# Patient Record
Sex: Female | Born: 1965 | Race: White | Hispanic: No | Marital: Single | Smoking: Current every day smoker
Health system: Southern US, Community
[De-identification: ages and names within clinical notes are randomized; demographics above are authoritative.]

## PROBLEM LIST (undated history)

## (undated) DIAGNOSIS — N879 Dysplasia of cervix uteri, unspecified: Secondary | ICD-10-CM

## (undated) DIAGNOSIS — F17200 Nicotine dependence, unspecified, uncomplicated: Secondary | ICD-10-CM

## (undated) DIAGNOSIS — I499 Cardiac arrhythmia, unspecified: Secondary | ICD-10-CM

## (undated) DIAGNOSIS — I341 Nonrheumatic mitral (valve) prolapse: Secondary | ICD-10-CM

## (undated) DIAGNOSIS — C801 Malignant (primary) neoplasm, unspecified: Secondary | ICD-10-CM

## (undated) DIAGNOSIS — J302 Other seasonal allergic rhinitis: Secondary | ICD-10-CM

## (undated) HISTORY — PX: TONSILLECTOMY: SUR1361

## (undated) HISTORY — PX: CERVICAL CONE BIOPSY: SUR198

---

## 2018-11-19 ENCOUNTER — Encounter: Payer: Self-pay | Admitting: *Deleted

## 2018-11-19 ENCOUNTER — Emergency Department: Payer: PRIVATE HEALTH INSURANCE

## 2018-11-19 ENCOUNTER — Other Ambulatory Visit: Payer: Self-pay

## 2018-11-19 ENCOUNTER — Emergency Department
Admission: EM | Admit: 2018-11-19 | Discharge: 2018-11-19 | Disposition: A | Discharge status: Home / Self Care | Payer: PRIVATE HEALTH INSURANCE | Attending: Emergency Medicine | Admitting: Emergency Medicine

## 2018-11-19 DIAGNOSIS — K8020 Calculus of gallbladder without cholecystitis without obstruction: Secondary | ICD-10-CM | POA: Insufficient documentation

## 2018-11-19 DIAGNOSIS — F17210 Nicotine dependence, cigarettes, uncomplicated: Secondary | ICD-10-CM | POA: Insufficient documentation

## 2018-11-19 DIAGNOSIS — K821 Hydrops of gallbladder: Secondary | ICD-10-CM | POA: Insufficient documentation

## 2018-11-19 DIAGNOSIS — R1010 Upper abdominal pain, unspecified: Secondary | ICD-10-CM | POA: Diagnosis present

## 2018-11-19 DIAGNOSIS — R1011 Right upper quadrant pain: Secondary | ICD-10-CM

## 2018-11-19 DIAGNOSIS — D135 Benign neoplasm of extrahepatic bile ducts: Secondary | ICD-10-CM

## 2018-11-19 LAB — CBC WITH DIFFERENTIAL/PLATELET
Abs Immature Granulocytes: 0.02 10*3/uL (ref 0.00–0.07)
Basophils Absolute: 0.1 10*3/uL (ref 0.0–0.1)
Basophils Relative: 1 %
Eosinophils Absolute: 0.3 10*3/uL (ref 0.0–0.5)
Eosinophils Relative: 4 %
HCT: 40.3 % (ref 36.0–46.0)
Hemoglobin: 13.5 g/dL (ref 12.0–15.0)
Immature Granulocytes: 0 %
Lymphocytes Relative: 32 %
Lymphs Abs: 2.4 10*3/uL (ref 0.7–4.0)
MCH: 31.5 pg (ref 26.0–34.0)
MCHC: 33.5 g/dL (ref 30.0–36.0)
MCV: 93.9 fL (ref 80.0–100.0)
Monocytes Absolute: 0.5 10*3/uL (ref 0.1–1.0)
Monocytes Relative: 7 %
Neutro Abs: 4.3 10*3/uL (ref 1.7–7.7)
Neutrophils Relative %: 56 %
Platelets: 263 10*3/uL (ref 150–400)
RBC: 4.29 MIL/uL (ref 3.87–5.11)
RDW: 13.4 % (ref 11.5–15.5)
WBC: 7.5 10*3/uL (ref 4.0–10.5)
nRBC: 0 % (ref 0.0–0.2)

## 2018-11-19 LAB — COMPREHENSIVE METABOLIC PANEL
ALT: 25 U/L (ref 0–44)
AST: 39 U/L (ref 15–41)
Albumin: 4.1 g/dL (ref 3.5–5.0)
Alkaline Phosphatase: 100 U/L (ref 38–126)
Anion gap: 9 (ref 5–15)
BUN: 25 mg/dL — ABNORMAL HIGH (ref 6–20)
CO2: 27 mmol/L (ref 22–32)
Calcium: 9.2 mg/dL (ref 8.9–10.3)
Chloride: 106 mmol/L (ref 98–111)
Creatinine, Ser: 0.73 mg/dL (ref 0.44–1.00)
GFR calc Af Amer: >60 mL/min (ref >60)
GFR calc non Af Amer: >60 mL/min (ref >60)
Glucose, Bld: 140 mg/dL — ABNORMAL HIGH (ref 70–99)
Potassium: 3.6 mmol/L (ref 3.5–5.1)
Sodium: 142 mmol/L (ref 135–145)
Total Bilirubin: 0.4 mg/dL (ref 0.3–1.2)
Total Protein: 7 g/dL (ref 6.5–8.1)

## 2018-11-19 LAB — LIPASE, BLOOD: Lipase: 37 U/L (ref 11–51)

## 2018-11-19 NOTE — ED Triage Notes (Signed)
Pt has upper abd pain and back pain.  Vomited x 1.  No diarrhea. Pt states she is concerned about her gallbladder.  Pt alert

## 2018-11-19 NOTE — ED Provider Notes (Signed)
[facility] Emergency Department Provider Note    First MD Initiated Contact with Patient 11/19/18 0216     (approximate)  I have reviewed the triage vital signs and the nursing notes.   HISTORY  Chief Complaint Abdominal Pain    HPI Holly Knox is a 53 y.o. female with medical history as listed below presents to the emergency department history of acute onset of upper abdominal discomfort which began tonight with one episode of nonbloody emesis.  Patient states that pain is since resolved.  Patient denies any diarrhea no fever.  Patient denies any urinary symptoms.        Past medical history Gallbladder polyp  There are no active problems to display for this patient.     Prior to Admission medications   Not on File    Allergies Patient has no known allergies.  No family history on file.  Social History Social History   Tobacco Use  . Smoking status: Current Every Day Smoker  . Smokeless tobacco: Never Used  Substance Use Topics  . Alcohol use: Yes  . Drug use: Not Currently    Review of Systems Constitutional: No fever/chills Eyes: No visual changes. ENT: No sore throat. Cardiovascular: Denies chest pain. Respiratory: Denies shortness of breath. Gastrointestinal: Positive for abdominal pain and vomiting..  No diarrhea.  No constipation. Genitourinary: Negative for dysuria. Musculoskeletal: Negative for neck pain.  Negative for back pain. Integumentary: Negative for rash. Neurological: Negative for headaches, focal weakness or numbness.   ____________________________________________   PHYSICAL EXAM:  VITAL SIGNS: ED Triage Vitals  Enc Vitals Group     BP 11/19/18 0212 (!) 142/82     Pulse Rate 11/19/18 0212 (!) 102     Resp 11/19/18 0212 18     Temp 11/19/18 0212 98.2 F (36.8 C)     Temp Source 11/19/18 0212 Oral     SpO2 11/19/18 0212 98 %     Weight 11/19/18 0213 68 kg (150 lb)     Height 11/19/18 0213  1.702 m (5\' 7" )     Head Circumference --      Peak Flow --      Pain Score 11/19/18 0213 0     Pain Loc --      Pain Edu? --      Excl. in [place]? --     Constitutional: Alert and oriented. Well appearing and in no acute distress. Eyes: Conjunctivae are normal.  Mouth/Throat: Mucous membranes are moist.  Oropharynx non-erythematous. Neck: No stridor.  Cardiovascular: Normal rate, regular rhythm. Good peripheral circulation. Grossly normal heart sounds. Respiratory: Normal respiratory effort.  No retractions. Lungs CTAB. Gastrointestinal: Soft and nontender. No distention.  Musculoskeletal: No lower extremity tenderness nor edema. No gross deformities of extremities. Neurologic:  Normal speech and language. No gross focal neurologic deficits are appreciated.  Skin:  Skin is warm, dry and intact. No rash noted. Psychiatric: Mood and affect are normal. Speech and behavior are normal.  ____________________________________________   LABS (all labs ordered are listed, but only abnormal results are displayed)  Labs Reviewed  COMPREHENSIVE METABOLIC PANEL - Abnormal; Notable for the following components:      Result Value   Glucose, Bld 140 (*)    BUN 25 (*)    All other components within normal limits  CBC WITH DIFFERENTIAL/PLATELET  LIPASE, BLOOD     RADIOLOGY I, Zap N , personally viewed and evaluated these images (plain radiographs) as part of my medical decision  making, as well as reviewing the written report by the radiologist.  ED MD interpretation: Cholelithiasis changes consistent with adenomyomatosis  Official radiology report(s): US Abdomen Limited Ruq  Result Date: 11/19/2018 CLINICAL DATA:  Right upper quadrant pain for 2 hours EXAM: ULTRASOUND ABDOMEN LIMITED RIGHT UPPER QUADRANT COMPARISON:  None. FINDINGS: Gallbladder: Gallbladder is well distended with multiple stones within. Negative sonographic Murphy's sign is seen. Wall thickening is 7 mm is noted with  changes of adenomyomatosis. Common bile duct: Diameter: 3.9 mm. Liver: Echogenicity is within normal limits. Small 9 mm cyst is noted within the right lobe anteriorly. Portal vein is patent on color Doppler imaging with normal direction of blood flow towards the liver. IMPRESSION: Cholelithiasis. Changes of adenomyomatosis. Right hepatic cyst Electronically Signed   By: Inez Catalina M.D.   On: 11/19/2018 03:55    ____________________________________________    Procedures   ____________________________________________   INITIAL IMPRESSION / MDM / ASSESSMENT AND PLAN / ED COURSE  As part of my medical decision making, I reviewed the following data within the [place] was evaluated in Emergency Department on 11/19/2018 for the symptoms described in the history of present illness. She was evaluated in the context of the global COVID-19 pandemic, which necessitated consideration that the patient might be at risk for infection with the SARS-CoV-2 virus that causes COVID-19. Institutional protocols and algorithms that pertain to the evaluation of patients at risk for COVID-19 are in a state of rapid change based on information released by regulatory bodies including the CDC and federal and state organizations. These policies and algorithms were followed during the patient's care in the ED.      53 year old female presenting with above-stated history and physical exam concerning for possible cholelithiasis which was confirmed on ultrasound.  Ultrasound also revealed evidence of adenomyomatosis.  Patient informed of all clinical findings.  Patient referred to Dr. Peyton Najjar general surgery for further outpatient evaluation.    ____________________________________________  FINAL CLINICAL IMPRESSION(S) / ED DIAGNOSES  Final diagnoses:  RUQ pain  Calculus of gallbladder without cholecystitis without obstruction  Adenomyomatosis of gallbladder     MEDICATIONS  GIVEN DURING THIS VISIT:  Medications - No data to display   ED Discharge Orders    None       Note:  This document was prepared using Dragon voice recognition software and may include unintentional dictation errors.   Gregor Hams, MD 11/19/18 [phone]

## 2018-11-19 NOTE — ED Notes (Signed)
Pt is back from ultrasound.

## 2019-01-20 ENCOUNTER — Ambulatory Visit: Payer: Self-pay | Admitting: General Surgery

## 2019-01-20 NOTE — H&P (View-Only) (Signed)
PATIENT PROFILE: Holly Knox is a 53 y.o. female who presents to the Clinic for consultation at the request of Dr. Owens Shark for evaluation of cholelithiasis.  PCP:  None  HISTORY OF PRESENT ILLNESS: [name] reports she presented with abdominal pain a week ago around 1 in the morning at the end of her work shift.  She reports that the pain started on the right upper quadrant and then generalized to the whole abdomen and back.  She reports that the pain lasted for about an hour which is usually longer than her usual pain attacks.  She reports that she has had these pain before, the last 1 a year and a half ago.  Patient reports that she is eating non healthy food due to crazy working hours.  She is also smoking and gaining weight.  She cannot associate the pain with increasing fluid.  Usually pain resolved by itself.  There is no alleviating factor.  She denies fever or chills.  She went to the ED for evaluation of this pain because it was longer than the usual 1.  At the ED she had an ultrasound that shows cholelithiasis but no sign of cholecystitis.  I personally reviewed the images of the ultrasound.  I also reviewed the labs done at the ED that shows no leukocytosis and normal liver enzymes.   PROBLEM LIST: Smoker Cholelithiasis  GENERAL REVIEW OF SYSTEMS:   General ROS: negative for - chills, fatigue, fever, positive for weight gain  Allergy and Immunology ROS: negative for - hives  Hematological and Lymphatic ROS: negative for - bleeding problems or bruising, negative for palpable nodes Endocrine ROS: negative for - heat or cold intolerance, hair changes Respiratory ROS: negative for - cough, shortness of breath or wheezing Cardiovascular ROS: no chest pain or palpitations GI ROS: negative for nausea, vomiting, diarrhea, constipation.  Positive for abdominal pain Musculoskeletal ROS: negative for - joint swelling or muscle pain Neurological ROS: negative for - confusion,  syncope Dermatological ROS: negative for pruritus and rash Psychiatric: negative for anxiety, depression, difficulty sleeping and memory loss  MEDICATIONS: CurrentMedications        Current Outpatient Medications  Medication Sig Dispense Refill  . ibuprofen (MOTRIN) 200 MG tablet Take 200 mg by mouth as needed for Pain     No current facility-administered medications for this visit.       ALLERGIES: Patient has no known allergies.  PAST MEDICAL HISTORY:     Past Medical History:  Diagnosis Date  . Hypoglycemia    mostly good now (2020)  . SVT (supraventricular tachycardia) (CMS-HCC)    ablation done    PAST SURGICAL HISTORY:      Past Surgical History:  Procedure Laterality Date  . cardiac ablation     age 27  . TONSILLECTOMY     age 31     FAMILY HISTORY:      Family History  Problem Relation Age of Onset  . Breast cancer Mother   . Alcohol abuse Father   . Alcohol abuse Brother   . Breast cancer Paternal Aunt   . Breast cancer Paternal Grandmother   . Alcohol abuse Paternal Grandfather      SOCIAL HISTORY: Social History          Socioeconomic History  . Marital status: Married    Spouse name: Not on file  . Number of children: Not on file  . Years of education: Not on file  . Highest education level: Not on file  Occupational History  . Not on file  Social Needs  . Financial resource strain: Not on file  . Food insecurity:    Worry: Not on file    Inability: Not on file  . Transportation needs:    Medical: Not on file    Non-medical: Not on file  Tobacco Use  . Smoking status: Current Every Day Smoker    Packs/day: 1.00    Types: Cigarettes  . Smokeless tobacco: Never Used  Substance and Sexual Activity  . Alcohol use: Yes  . Drug use: Never  . Sexual activity: Not on file  Other Topics Concern  . Not on file  Social History Narrative  . Not on file      PHYSICAL EXAM:    Vitals:    12/01/18 1005  BP: 136/81  Pulse: 101   Body mass index is 24.59 kg/m. Weight: 71.2 kg (157 lb)   GENERAL: Alert, active, oriented x3  HEENT: Pupils equal reactive to light. Extraocular movements are intact. Sclera clear. Palpebral conjunctiva normal red color.Pharynx clear.  NECK: Supple with no palpable mass and no adenopathy.  LUNGS: Sound clear with no rales rhonchi or wheezes.  HEART: Regular rhythm S1 and S2 without murmur.  ABDOMEN: Soft and depressible, nontender with no palpable mass, no hepatomegaly.   EXTREMITIES: Well-developed well-nourished symmetrical with no dependent edema.  NEUROLOGICAL: Awake alert oriented, facial expression symmetrical, moving all extremities.  REVIEW OF DATA: I personally reviewed the ultrasound done at the ED that shows gallstones, normal I wall and no pericholecystic fluid.  Labs at the ED also shows normal white blood cell count, normal hemoglobin and normal liver enzymes.  ASSESSMENT: [name] is a 53 y.o. female presenting for consultation for cholelithiasis.    Patient was oriented about the diagnosis of cholelithiasis. Also oriented about what is the gallbladder, its anatomy and function and the implications of having stones. The patient was oriented about the treatment alternatives (observation vs cholecystectomy). Patient was oriented that a low percentage of patient will continue to have similar pain symptoms even after the gallbladder is removed. Surgical technique (open vs laparoscopic) was discussed. It was also discussed the goals of the surgery (decrease the pain episodes and avoid the risk of cholecystitis) and the risk of surgery including: bleeding, infection, common bile duct injury, stone retention, injury to other organs such as bowel, liver, stomach, other complications such as hernia, bowel obstruction among others. Also discussed with patient about anesthesia and its complications such as: reaction to  medications, pneumonia, heart complications, death, among others.  Due to the COVID-19 pandemia, and the patient pain completely asymptomatic at this moment, I recommend to hold the surgery until it is safer to do surgery.  At this moment there is a getting infected with the virus or exposing the healthcare staff to getting infected and the use of PPE's is higher than the benefit that the patient that has had few episode of biliary colic in the last few years.  She understand the situation and agreed to hold the surgery until elective surgery are resumed.  She understand that if she develops severe abdominal pain, fever or nonhealing abdominal pain she will need to go to the ED for further evaluation and possible urgent cholecystectomy.  Cholelithiasis without cholecystitis [K80.20]  PLAN: 1. Laparoscopic cholecystectomy (42595) 2. CMC, CMP done (11/19/2018) 3. Avoid aspirin 5 days before surgery 4. Avoid fatty/greasy food as possible 5. Recommend smoking cessation  6. Will contact you to coordinate surgery  day when elective cases are resumed.  7. Contact us if has any question or concern.   Patient verbalized understanding, all questions were answered, and were agreeable with the plan outlined above.     Herbert Pun, MD  Electronically signed by Herbert Pun, MD

## 2019-01-20 NOTE — H&P (Signed)
PATIENT PROFILE: Holly Knox is a 53 y.o. female who presents to the Clinic for consultation at the request of Dr. Owens Shark for evaluation of cholelithiasis.  PCP:  None  HISTORY OF PRESENT ILLNESS: [name] reports she presented with abdominal pain a week ago around 1 in the morning at the end of her work shift.  She reports that the pain started on the right upper quadrant and then generalized to the whole abdomen and back.  She reports that the pain lasted for about an hour which is usually longer than her usual pain attacks.  She reports that she has had these pain before, the last 1 a year and a half ago.  Patient reports that she is eating non healthy food due to crazy working hours.  She is also smoking and gaining weight.  She cannot associate the pain with increasing fluid.  Usually pain resolved by itself.  There is no alleviating factor.  She denies fever or chills.  She went to the ED for evaluation of this pain because it was longer than the usual 1.  At the ED she had an ultrasound that shows cholelithiasis but no sign of cholecystitis.  I personally reviewed the images of the ultrasound.  I also reviewed the labs done at the ED that shows no leukocytosis and normal liver enzymes.   PROBLEM LIST: Smoker Cholelithiasis  GENERAL REVIEW OF SYSTEMS:   General ROS: negative for - chills, fatigue, fever, positive for weight gain  Allergy and Immunology ROS: negative for - hives  Hematological and Lymphatic ROS: negative for - bleeding problems or bruising, negative for palpable nodes Endocrine ROS: negative for - heat or cold intolerance, hair changes Respiratory ROS: negative for - cough, shortness of breath or wheezing Cardiovascular ROS: no chest pain or palpitations GI ROS: negative for nausea, vomiting, diarrhea, constipation.  Positive for abdominal pain Musculoskeletal ROS: negative for - joint swelling or muscle pain Neurological ROS: negative for - confusion,  syncope Dermatological ROS: negative for pruritus and rash Psychiatric: negative for anxiety, depression, difficulty sleeping and memory loss  MEDICATIONS: CurrentMedications        Current Outpatient Medications  Medication Sig Dispense Refill  . ibuprofen (MOTRIN) 200 MG tablet Take 200 mg by mouth as needed for Pain     No current facility-administered medications for this visit.       ALLERGIES: Patient has no known allergies.  PAST MEDICAL HISTORY:     Past Medical History:  Diagnosis Date  . Hypoglycemia    mostly good now (2020)  . SVT (supraventricular tachycardia) (CMS-HCC)    ablation done    PAST SURGICAL HISTORY:      Past Surgical History:  Procedure Laterality Date  . cardiac ablation     age 56  . TONSILLECTOMY     age 69     FAMILY HISTORY:      Family History  Problem Relation Age of Onset  . Breast cancer Mother   . Alcohol abuse Father   . Alcohol abuse Brother   . Breast cancer Paternal Aunt   . Breast cancer Paternal Grandmother   . Alcohol abuse Paternal Grandfather      SOCIAL HISTORY: Social History          Socioeconomic History  . Marital status: Married    Spouse name: Not on file  . Number of children: Not on file  . Years of education: Not on file  . Highest education level: Not on file  Occupational History  . Not on file  Social Needs  . Financial resource strain: Not on file  . Food insecurity:    Worry: Not on file    Inability: Not on file  . Transportation needs:    Medical: Not on file    Non-medical: Not on file  Tobacco Use  . Smoking status: Current Every Day Smoker    Packs/day: 1.00    Types: Cigarettes  . Smokeless tobacco: Never Used  Substance and Sexual Activity  . Alcohol use: Yes  . Drug use: Never  . Sexual activity: Not on file  Other Topics Concern  . Not on file  Social History Narrative  . Not on file      PHYSICAL EXAM:    Vitals:    12/01/18 1005  BP: 136/81  Pulse: 101   Body mass index is 24.59 kg/m. Weight: 71.2 kg (157 lb)   GENERAL: Alert, active, oriented x3  HEENT: Pupils equal reactive to light. Extraocular movements are intact. Sclera clear. Palpebral conjunctiva normal red color.Pharynx clear.  NECK: Supple with no palpable mass and no adenopathy.  LUNGS: Sound clear with no rales rhonchi or wheezes.  HEART: Regular rhythm S1 and S2 without murmur.  ABDOMEN: Soft and depressible, nontender with no palpable mass, no hepatomegaly.   EXTREMITIES: Well-developed well-nourished symmetrical with no dependent edema.  NEUROLOGICAL: Awake alert oriented, facial expression symmetrical, moving all extremities.  REVIEW OF DATA: I personally reviewed the ultrasound done at the ED that shows gallstones, normal I wall and no pericholecystic fluid.  Labs at the ED also shows normal white blood cell count, normal hemoglobin and normal liver enzymes.  ASSESSMENT: [name] is a 53 y.o. female presenting for consultation for cholelithiasis.    Patient was oriented about the diagnosis of cholelithiasis. Also oriented about what is the gallbladder, its anatomy and function and the implications of having stones. The patient was oriented about the treatment alternatives (observation vs cholecystectomy). Patient was oriented that a low percentage of patient will continue to have similar pain symptoms even after the gallbladder is removed. Surgical technique (open vs laparoscopic) was discussed. It was also discussed the goals of the surgery (decrease the pain episodes and avoid the risk of cholecystitis) and the risk of surgery including: bleeding, infection, common bile duct injury, stone retention, injury to other organs such as bowel, liver, stomach, other complications such as hernia, bowel obstruction among others. Also discussed with patient about anesthesia and its complications such as: reaction to  medications, pneumonia, heart complications, death, among others.  Due to the COVID-19 pandemia, and the patient pain completely asymptomatic at this moment, I recommend to hold the surgery until it is safer to do surgery.  At this moment there is a getting infected with the virus or exposing the healthcare staff to getting infected and the use of PPE's is higher than the benefit that the patient that has had few episode of biliary colic in the last few years.  She understand the situation and agreed to hold the surgery until elective surgery are resumed.  She understand that if she develops severe abdominal pain, fever or nonhealing abdominal pain she will need to go to the ED for further evaluation and possible urgent cholecystectomy.  Cholelithiasis without cholecystitis [K80.20]  PLAN: 1. Laparoscopic cholecystectomy (19622) 2. CMC, CMP done (11/19/2018) 3. Avoid aspirin 5 days before surgery 4. Avoid fatty/greasy food as possible 5. Recommend smoking cessation  6. Will contact you to coordinate surgery  day when elective cases are resumed.  7. Contact us if has any question or concern.   Patient verbalized understanding, all questions were answered, and were agreeable with the plan outlined above.     Herbert Pun, MD  Electronically signed by Herbert Pun, MD

## 2019-01-21 ENCOUNTER — Encounter
Admission: RE | Admit: 2019-01-21 | Discharge: 2019-01-21 | Disposition: A | Discharge status: Home / Self Care | Payer: PRIVATE HEALTH INSURANCE | Source: Ambulatory Visit | Attending: General Surgery | Admitting: General Surgery

## 2019-01-21 ENCOUNTER — Encounter: Payer: Self-pay | Admitting: *Deleted

## 2019-01-21 ENCOUNTER — Other Ambulatory Visit: Payer: Self-pay

## 2019-01-21 DIAGNOSIS — Z1159 Encounter for screening for other viral diseases: Secondary | ICD-10-CM | POA: Insufficient documentation

## 2019-01-21 DIAGNOSIS — Z01812 Encounter for preprocedural laboratory examination: Secondary | ICD-10-CM | POA: Diagnosis present

## 2019-01-21 HISTORY — DX: Other seasonal allergic rhinitis: J30.2

## 2019-01-21 HISTORY — DX: Dysplasia of cervix uteri, unspecified: N87.9

## 2019-01-21 HISTORY — DX: Cardiac arrhythmia, unspecified: I49.9

## 2019-01-21 HISTORY — DX: Nonrheumatic mitral (valve) prolapse: I34.1

## 2019-01-21 HISTORY — DX: Nicotine dependence, unspecified, uncomplicated: F17.200

## 2019-01-21 NOTE — Patient Instructions (Signed)
Your procedure is scheduled on: Mon. 6/22 Report to Day Surgery. To find out your arrival time please call [phone] between 1PM - 3PM on Fri.6/19.  Remember: Instructions that are not followed completely may result in serious medical risk,  up to and including death, or upon the discretion of your surgeon and anesthesiologist your  surgery may need to be rescheduled.     _X__ 1. Do not eat food after midnight the night before your procedure.                 No gum chewing or hard candies. You may drink clear liquids up to 2 hours                 before you are scheduled to arrive for your surgery- DO not drink clear                 liquids within 2 hours of the start of your surgery.                 Clear Liquids include:  water, apple juice without pulp, clear carbohydrate                 drink such as Clearfast of Gatorade, Black Coffee or Tea (Do not add                 anything to coffee or tea).  __X__2.  On the morning of surgery brush your teeth with toothpaste and water, you                may rinse your mouth with mouthwash if you wish.  Do not swallow any toothpaste of mouthwash.     _X__ 3.  No Alcohol for 24 hours before or after surgery.   _X__ 4.  Do Not Smoke or use e-cigarettes For 24 Hours Prior to Your Surgery.                 Do not use any chewable tobacco products for at least 6 hours prior to                 surgery.  ____  5.  Bring all medications with you on the day of surgery if instructed.   _x___  6.  Notify your doctor if there is any change in your medical condition      (cold, fever, infections).     Do not wear jewelry, make-up, hairpins, clips or nail polish. Do not wear lotions, powders, or perfumes. You may wear deodorant. Do not shave 48 hours prior to surgery. Men may shave face and neck. Do not bring valuables to the hospital.    [facility] is not responsible for any belongings or valuables.  Contacts, dentures  or bridgework may not be worn into surgery. Leave your suitcase in the car. After surgery it may be brought to your room. For patients admitted to the hospital, discharge time is determined by your treatment team.   Patients discharged the day of surgery will not be allowed to drive home.   Please read over the following fact sheets that you were given:  ____ Take these medicines the morning of surgery with A SIP OF WATER:    1. None  2.   3.   4.  5.  6.  ____ Fleet Enema (as directed)   _x___ Use CHG Soap as directed  ____ Use inhalers on the day of surgery  ____  Stop metformin 2 days prior to surgery    ____ Take 1/2 of usual insulin dose the night before surgery. No insulin the morning          of surgery.   ____ Stop Coumadin/Plavix/aspirin on   __x__ Stop Anti-inflammatories ibuprofen, Aleve   May take Tylenol   ____ Stop supplements until after surgery.    ____ Bring C-Pap to the hospital.

## 2019-01-22 ENCOUNTER — Other Ambulatory Visit: Payer: PRIVATE HEALTH INSURANCE

## 2019-01-22 LAB — NOVEL CORONAVIRUS, NAA (HOSP ORDER, SEND-OUT TO REF LAB; TAT 18-24 HRS): SARS-CoV-2, NAA: NOT DETECTED

## 2019-01-24 MED ORDER — CEFAZOLIN SODIUM-DEXTROSE 2-4 GM/100ML-% IV SOLN
2.0000 g | INTRAVENOUS | Status: AC
  Administered 2019-01-25: 15:00:00 2 g via INTRAVENOUS

## 2019-01-25 ENCOUNTER — Ambulatory Visit
Admission: RE | Admit: 2019-01-25 | Discharge: 2019-01-25 | Disposition: A | Discharge status: Home / Self Care | Payer: PRIVATE HEALTH INSURANCE | Attending: General Surgery | Admitting: General Surgery

## 2019-01-25 ENCOUNTER — Ambulatory Visit: Payer: PRIVATE HEALTH INSURANCE | Admitting: Anesthesiology

## 2019-01-25 ENCOUNTER — Encounter: Payer: Self-pay | Admitting: *Deleted

## 2019-01-25 ENCOUNTER — Other Ambulatory Visit: Payer: Self-pay

## 2019-01-25 ENCOUNTER — Encounter
Admission: RE | Disposition: A | Discharge status: Home / Self Care | Payer: Self-pay | Source: Home / Self Care | Attending: General Surgery

## 2019-01-25 DIAGNOSIS — K429 Umbilical hernia without obstruction or gangrene: Secondary | ICD-10-CM | POA: Insufficient documentation

## 2019-01-25 DIAGNOSIS — F17200 Nicotine dependence, unspecified, uncomplicated: Secondary | ICD-10-CM | POA: Insufficient documentation

## 2019-01-25 DIAGNOSIS — K8010 Calculus of gallbladder with chronic cholecystitis without obstruction: Secondary | ICD-10-CM | POA: Insufficient documentation

## 2019-01-25 HISTORY — PX: UMBILICAL HERNIA REPAIR: SHX196

## 2019-01-25 HISTORY — PX: CHOLECYSTECTOMY: SHX55

## 2019-01-25 HISTORY — DX: Malignant (primary) neoplasm, unspecified: C80.1

## 2019-01-25 LAB — GLUCOSE, CAPILLARY: Glucose-Capillary: 93 mg/dL (ref 70–99)

## 2019-01-25 SURGERY — LAPAROSCOPIC CHOLECYSTECTOMY
Anesthesia: General

## 2019-01-25 MED ORDER — PROPOFOL 10 MG/ML IV BOLUS
INTRAVENOUS | Status: DC | PRN
  Administered 2019-01-25: 150 mg via INTRAVENOUS

## 2019-01-25 MED ORDER — SODIUM CHLORIDE 0.9 % IV SOLN
INTRAVENOUS | Status: DC
  Administered 2019-01-25: 16:00:00 via INTRAVENOUS

## 2019-01-25 MED ORDER — DEXAMETHASONE SODIUM PHOSPHATE 10 MG/ML IJ SOLN
INTRAMUSCULAR | Status: DC | PRN
  Administered 2019-01-25: 10 mg via INTRAVENOUS

## 2019-01-25 MED ORDER — BUPIVACAINE-EPINEPHRINE (PF) 0.5% -1:200000 IJ SOLN
INTRAMUSCULAR | Status: DC | PRN
  Administered 2019-01-25: 11 mL
  Administered 2019-01-25: 19 mL

## 2019-01-25 MED ORDER — CEFAZOLIN SODIUM-DEXTROSE 2-4 GM/100ML-% IV SOLN
INTRAVENOUS | Status: AC
  Filled 2019-01-25: qty 100

## 2019-01-25 MED ORDER — BUPIVACAINE-EPINEPHRINE (PF) 0.5% -1:200000 IJ SOLN
INTRAMUSCULAR | Status: AC
  Filled 2019-01-25: qty 30

## 2019-01-25 MED ORDER — LACTATED RINGERS IV SOLN
INTRAVENOUS | Status: DC | PRN
  Administered 2019-01-25: 14:00:00 via INTRAVENOUS

## 2019-01-25 MED ORDER — MIDAZOLAM HCL 2 MG/2ML IJ SOLN
INTRAMUSCULAR | Status: AC
  Filled 2019-01-25: qty 2

## 2019-01-25 MED ORDER — ACETAMINOPHEN 10 MG/ML IV SOLN
INTRAVENOUS | Status: AC
  Filled 2019-01-25: qty 100

## 2019-01-25 MED ORDER — MIDAZOLAM HCL 2 MG/2ML IJ SOLN
INTRAMUSCULAR | Status: DC | PRN
  Administered 2019-01-25: 2 mg via INTRAVENOUS

## 2019-01-25 MED ORDER — SUGAMMADEX SODIUM 200 MG/2ML IV SOLN
INTRAVENOUS | Status: DC | PRN
  Administered 2019-01-25: 136 mg via INTRAVENOUS

## 2019-01-25 MED ORDER — ROCURONIUM BROMIDE 100 MG/10ML IV SOLN: INTRAVENOUS | Status: DC | PRN

## 2019-01-25 MED ORDER — LIDOCAINE HCL (CARDIAC) PF 100 MG/5ML IV SOSY
PREFILLED_SYRINGE | INTRAVENOUS | Status: DC | PRN
  Administered 2019-01-25: 100 mg via INTRAVENOUS

## 2019-01-25 MED ORDER — FENTANYL CITRATE (PF) 250 MCG/5ML IJ SOLN
INTRAMUSCULAR | Status: AC
  Filled 2019-01-25: qty 5

## 2019-01-25 MED ORDER — ONDANSETRON HCL 4 MG/2ML IJ SOLN
INTRAMUSCULAR | Status: DC | PRN
  Administered 2019-01-25: 4 mg via INTRAVENOUS

## 2019-01-25 MED ORDER — HYDROCODONE-ACETAMINOPHEN 5-325 MG PO TABS: 1.0000 | ORAL_TABLET | ORAL | 0 refills | Status: AC | PRN

## 2019-01-25 MED ORDER — LIDOCAINE HCL (CARDIAC) PF 100 MG/5ML IV SOSY: PREFILLED_SYRINGE | INTRAVENOUS | Status: DC | PRN

## 2019-01-25 MED ORDER — FAMOTIDINE 20 MG PO TABS
ORAL_TABLET | ORAL | Status: AC
  Administered 2019-01-25: 14:00:00 20 mg via ORAL
  Filled 2019-01-25: qty 1

## 2019-01-25 MED ORDER — FENTANYL CITRATE (PF) 100 MCG/2ML IJ SOLN
INTRAMUSCULAR | Status: AC
  Administered 2019-01-25: 25 ug via INTRAVENOUS
  Filled 2019-01-25: qty 2

## 2019-01-25 MED ORDER — ROCURONIUM BROMIDE 100 MG/10ML IV SOLN
INTRAVENOUS | Status: DC | PRN
  Administered 2019-01-25: 40 mg via INTRAVENOUS
  Administered 2019-01-25: 10 mg via INTRAVENOUS

## 2019-01-25 MED ORDER — PROPOFOL 10 MG/ML IV BOLUS: INTRAVENOUS | Status: DC | PRN

## 2019-01-25 MED ORDER — SUCCINYLCHOLINE CHLORIDE 20 MG/ML IJ SOLN: INTRAMUSCULAR | Status: DC | PRN

## 2019-01-25 MED ORDER — FENTANYL CITRATE (PF) 100 MCG/2ML IJ SOLN: INTRAMUSCULAR | Status: DC | PRN

## 2019-01-25 MED ORDER — FAMOTIDINE 20 MG PO TABS
20.0000 mg | ORAL_TABLET | Freq: Once | ORAL | Status: AC
  Administered 2019-01-25: 20 mg via ORAL

## 2019-01-25 MED ORDER — ONDANSETRON HCL 4 MG/2ML IJ SOLN: 4.0000 mg | Freq: Once | INTRAMUSCULAR | Status: DC | PRN

## 2019-01-25 MED ORDER — FENTANYL CITRATE (PF) 100 MCG/2ML IJ SOLN
25.0000 ug | INTRAMUSCULAR | Status: DC | PRN
  Administered 2019-01-25 (×4): 25 ug via INTRAVENOUS

## 2019-01-25 MED ORDER — SUCCINYLCHOLINE CHLORIDE 20 MG/ML IJ SOLN
INTRAMUSCULAR | Status: DC | PRN
  Administered 2019-01-25: 100 mg via INTRAVENOUS

## 2019-01-25 MED ORDER — FENTANYL CITRATE (PF) 100 MCG/2ML IJ SOLN
INTRAMUSCULAR | Status: DC | PRN
  Administered 2019-01-25 (×5): 50 ug via INTRAVENOUS

## 2019-01-25 SURGICAL SUPPLY — 42 items
APPLIER CLIP 5 13 M/L LIGAMAX5 (MISCELLANEOUS) ×4
BLADE SURG SZ11 CARB STEEL (BLADE) ×4 IMPLANT
CANISTER SUCT 1200ML W/VALVE (MISCELLANEOUS) ×4 IMPLANT
CATH CHOLANG 76X19 KUMAR (CATHETERS) ×2 IMPLANT
CHLORAPREP W/TINT 26 (MISCELLANEOUS) ×4 IMPLANT
CLIP APPLIE 5 13 M/L LIGAMAX5 (MISCELLANEOUS) ×2 IMPLANT
COVER WAND RF STERILE (DRAPES) ×2 IMPLANT
DERMABOND ADVANCED (GAUZE/BANDAGES/DRESSINGS) ×2
DERMABOND ADVANCED .7 DNX12 (GAUZE/BANDAGES/DRESSINGS) ×2 IMPLANT
ELECT REM PT RETURN 9FT ADLT (ELECTROSURGICAL) ×4
ELECTRODE REM PT RTRN 9FT ADLT (ELECTROSURGICAL) ×2 IMPLANT
GLOVE BIO SURGEON STRL SZ 6.5 (GLOVE) ×3 IMPLANT
GLOVE BIO SURGEONS STRL SZ 6.5 (GLOVE) ×1
GLOVE INDICATOR 6.5 STRL GRN (GLOVE) ×4 IMPLANT
GOWN STRL REUS W/ TWL LRG LVL3 (GOWN DISPOSABLE) ×8 IMPLANT
GOWN STRL REUS W/TWL LRG LVL3 (GOWN DISPOSABLE) ×8
GRASPER SUT TROCAR 14GX15 (MISCELLANEOUS) IMPLANT
HEMOSTAT SURGICEL 2X3 (HEMOSTASIS) IMPLANT
IRRIGATION STRYKERFLOW (MISCELLANEOUS) ×2 IMPLANT
IRRIGATOR STRYKERFLOW (MISCELLANEOUS)
IV NS 1000ML (IV SOLUTION)
IV NS 1000ML BAXH (IV SOLUTION) ×2 IMPLANT
KIT TURNOVER KIT A (KITS) ×4 IMPLANT
LABEL OR SOLS (LABEL) ×2 IMPLANT
NDL HYPO 25X1 1.5 SAFETY (NEEDLE) ×2 IMPLANT
NDL INSUFFLATION 14GA 120MM (NEEDLE) ×2 IMPLANT
NEEDLE HYPO 25X1 1.5 SAFETY (NEEDLE) ×4 IMPLANT
NEEDLE INSUFFLATION 14GA 120MM (NEEDLE) ×4 IMPLANT
NS IRRIG 500ML POUR BTL (IV SOLUTION) ×4 IMPLANT
PACK LAP CHOLECYSTECTOMY (MISCELLANEOUS) ×4 IMPLANT
PENCIL ELECTRO HAND CTR (MISCELLANEOUS) ×2 IMPLANT
POUCH SPECIMEN RETRIEVAL 10MM (ENDOMECHANICALS) ×4 IMPLANT
SCISSORS METZENBAUM CVD 33 (INSTRUMENTS) ×4 IMPLANT
SET TUBE SMOKE EVAC HIGH FLOW (TUBING) ×4 IMPLANT
SLEEVE ENDOPATH XCEL 5M (ENDOMECHANICALS) ×8 IMPLANT
SUT MNCRL AB 4-0 PS2 18 (SUTURE) ×4 IMPLANT
SUT VIC AB 0 CT1 36 (SUTURE) IMPLANT
SUT VIC AB 2-0 SH 27 (SUTURE) ×2
SUT VIC AB 2-0 SH 27XBRD (SUTURE) IMPLANT
SUT VICRYL 0 AB UR-6 (SUTURE) ×6 IMPLANT
TROCAR XCEL NON-BLD 11X100MML (ENDOMECHANICALS) ×4 IMPLANT
TROCAR XCEL NON-BLD 5MMX100MML (ENDOMECHANICALS) ×4 IMPLANT

## 2019-01-25 NOTE — Anesthesia Postprocedure Evaluation (Signed)
Anesthesia Post Note  Patient: Holly Knox  Procedure(s) Performed: LAPAROSCOPIC CHOLECYSTECTOMY (N/A ) HERNIA REPAIR UMBILICAL ADULT  Patient location during evaluation: PACU Anesthesia Type: General Level of consciousness: awake and alert Pain management: pain level controlled Vital Signs Assessment: post-procedure vital signs reviewed and stable Respiratory status: spontaneous breathing and respiratory function stable Cardiovascular status: stable Anesthetic complications: no     Last Vitals:  Vitals:   01/25/19 1605 01/25/19 1610  BP: 137/88   Pulse: 82 81  Resp: (!) 9 11  Temp:    SpO2: 100% 98%    Last Pain:  Vitals:   01/25/19 1610  TempSrc:   PainSc: 5                  KEPHART,WILLIAM K

## 2019-01-25 NOTE — Anesthesia Procedure Notes (Signed)
Performed by: Nelda Marseille, CRNA Secured at: 21 cm

## 2019-01-25 NOTE — Op Note (Addendum)
Preoperative diagnosis: Symptomatic cholelithiasis  Postoperative diagnosis: Symptomatic cholelithiasis.                                              Umbilical hernia  Procedure: Laparoscopic Cholecystectomy.                      Umbilical hernia repair.    Anesthesia: GETA   Surgeon: Dr. Windell Moment  Wound Classification: Clean Contaminated  Indications: Patient is a 53 y.o. female developed right upper quadrant pain and nausea and on workup was found to have cholelithiasis with a normal common duct. Laparoscopic cholecystectomy was elected.  Findings: Critical view of safety achieved Cystic duct and artery identified, ligated and divided Adequate hemostasis  Description of procedure: The patient was placed on the operating table in the supine position. General anesthesia was induced. A time-out was completed verifying correct patient, procedure, site, positioning, and implant(s) and/or special equipment prior to beginning this procedure. An orogastric tube was placed. The abdomen was prepped and draped in the usual sterile fashion.  An incision was made in a natural skin line above the umbilicus.  The fascia was elevated and the Veress needle inserted. Proper position was confirmed by aspiration and saline meniscus test.  The abdomen was insufflated with carbon dioxide to a pressure of 15 mmHg. The patient tolerated insufflation well. A 11-mm trocar was then inserted.  The laparoscope was inserted and the abdomen inspected. No injuries from initial trocar placement were noted. Additional trocars were then inserted in the following locations: a 5-mm trocar in the right epigastrium and two 5-mm trocars along the right costal margin. The abdomen was inspected and no abnormalities were found. The table was placed in the reverse Trendelenburg position with the right side up.  Filmy adhesions between the gallbladder and omentum, duodenum and transverse colon were lysed sharply. The dome of the  gallbladder was grasped with an atraumatic grasper passed through the lateral port and retracted over the dome of the liver. The infundibulum was also grasped with an atraumatic grasper through the midclavicular port and retracted toward the right lower quadrant. This maneuver exposed Calot's triangle. The peritoneum overlying the gallbladder infundibulum was then incised and the cystic duct and cystic artery identified and circumferentially dissected. Critical view of safety reviewed before ligating any structure. The cystic duct and cystic artery were then doubly clipped and divided close to the gallbladder.  The gallbladder was then dissected from its peritoneal attachments by electrocautery. Hemostasis was checked and the gallbladder and contained stones were removed using an endoscopic retrieval bag placed through the umbilical port. The gallbladder was passed off the table as a specimen. The gallbladder fossa was copiously irrigated with saline and hemostasis was obtained. There was no evidence of bleeding from the gallbladder fossa or cystic artery or leakage of the bile from the cystic duct stump. Secondary trocars were removed under direct vision. No bleeding was noted. The laparoscope was withdrawn and the umbilical trocar removed. The abdomen was allowed to collapse.  The supra umbilical incision was extended. The incision was deepened to the fascia. The hernia sac was then identified and dissected free. The peritoneum of the sac was dissected from the umbilical stalk and able to be reduced easily. The fascia was carefully palpated no additional defects were identified. The hernia defect was closed with interrupted Vicryl  0 suture on midline. The umbilical stalk was fixed to fascia with 2-0 Vicryl and the skin wounds were closed with subcuticular sutures of Monocryl 4-0. The orogastric tube was removed. The patient tolerated the procedure well and was taken to the postanesthesia care unit in stable  condition.   Specimen: Gallbladder  Complications: None  EBL: 5 mL

## 2019-01-25 NOTE — Anesthesia Procedure Notes (Signed)
Procedure Name: Intubation Date/Time: 01/25/2019 2:48 PM Performed by: Nelda Marseille, CRNA Pre-anesthesia Checklist: Patient identified, Patient being monitored, Timeout performed, Emergency Drugs available and Suction available Patient Re-evaluated:Patient Re-evaluated prior to induction Oxygen Delivery Method: Circle system utilized Preoxygenation: Pre-oxygenation with 100% oxygen Induction Type: IV induction Ventilation: Mask ventilation without difficulty Laryngoscope Size: Mac, 3 and McGraph Grade View: Grade II Tube type: Oral Tube size: 7.0 mm Number of attempts: 1 Airway Equipment and Method: Stylet and Video-laryngoscopy Placement Confirmation: ETT inserted through vocal cords under direct vision,  positive ETCO2 and breath sounds checked- equal and bilateral Secured at: 21 cm Tube secured with: Tape Dental Injury: Teeth and Oropharynx as per pre-operative assessment

## 2019-01-25 NOTE — Discharge Instructions (Addendum)
  Diet: Resume home heart healthy regular diet.   Activity: No heavy lifting >20 pounds (children, pets, laundry, garbage) or strenuous activity until follow-up, but light activity and walking are encouraged. Do not drive or drink alcohol if taking narcotic pain medications.  Wound care: May shower with soapy water and pat dry (do not rub incisions), but no baths or submerging incision underwater until follow-up. (no swimming)   Medications: Resume all home medications. For mild to moderate pain: acetaminophen (Tylenol) or ibuprofen (if no kidney disease). Combining Tylenol with alcohol can substantially increase your risk of causing liver disease. Narcotic pain medications, if prescribed, can be used for severe pain, though may cause nausea, constipation, and drowsiness. Do not combine Tylenol and Norco within a 6 hour period as Norco contains Tylenol. If you do not need the narcotic pain medication, you do not need to fill the prescription.  Call office ([phone]) at any time if any questions, worsening pain, fevers/chills, bleeding, drainage from incision site, or other concerns.   AMBULATORY SURGERY  DISCHARGE INSTRUCTIONS   1) The drugs that you were given will stay in your system until tomorrow so for the next 24 hours you should not:  A) Drive an automobile B) Make any legal decisions C) Drink any alcoholic beverage   2) You may resume regular meals tomorrow.  Today it is better to start with liquids and gradually work up to solid foods.  You may eat anything you prefer, but it is better to start with liquids, then soup and crackers, and gradually work up to solid foods.   3) Please notify your doctor immediately if you have any unusual bleeding, trouble breathing, redness and pain at the surgery site, drainage, fever, or pain not relieved by medication.    4) Additional Instructions:        Please contact your physician with any problems or Same Day Surgery at  [phone], Monday through Friday 6 am to 4 pm, or [place] [facility] Main number at [phone]. 

## 2019-01-25 NOTE — Anesthesia Preprocedure Evaluation (Signed)
Anesthesia Evaluation  Patient identified by MRN, date of birth, ID band Patient awake    Reviewed: Allergy & Precautions, NPO status , Patient's Chart, lab work & pertinent test results, reviewed documented beta blocker date and time   Airway Mallampati: II  TM Distance: >3 FB     Dental  (+) Chipped, Upper Dentures   Pulmonary Current Smoker,           Cardiovascular + dysrhythmias Supra Ventricular Tachycardia      Neuro/Psych    GI/Hepatic   Endo/Other    Renal/GU      Musculoskeletal   Abdominal   Peds  Hematology   Anesthesia Other Findings SVT treated with ablation.smokes.  Reproductive/Obstetrics                             Anesthesia Physical Anesthesia Plan  ASA: III  Anesthesia Plan: General   Post-op Pain Management:    Induction: Intravenous  PONV Risk Score and Plan:   Airway Management Planned: Oral ETT  Additional Equipment:   Intra-op Plan:   Post-operative Plan:   Informed Consent: I have reviewed the patients History and Physical, chart, labs and discussed the procedure including the risks, benefits and alternatives for the proposed anesthesia with the patient or authorized representative who has indicated his/her understanding and acceptance.       Plan Discussed with: CRNA  Anesthesia Plan Comments:         Anesthesia Quick Evaluation

## 2019-01-25 NOTE — Anesthesia Post-op Follow-up Note (Signed)
Anesthesia QCDR form completed.        

## 2019-01-25 NOTE — Transfer of Care (Signed)
Immediate Anesthesia Transfer of Care Note  Patient: Holly Knox  Procedure(s) Performed: LAPAROSCOPIC CHOLECYSTECTOMY (N/A ) HERNIA REPAIR UMBILICAL ADULT  Patient Location: PACU  Anesthesia Type:General  Level of Consciousness: awake, alert  and oriented  Airway & Oxygen Therapy: Patient Spontanous Breathing and Patient connected to face mask oxygen  Post-op Assessment: Report given to RN and Post -op Vital signs reviewed and stable  Post vital signs: Reviewed and stable  Last Vitals:  Vitals Value Taken Time  BP    Temp    Pulse 91 01/25/19 1554  Resp 31 01/25/19 1554  SpO2 100 % 01/25/19 1554  Vitals shown include unvalidated device data.  Last Pain:  Vitals:   01/25/19 1349  TempSrc: Temporal  PainSc: 0-No pain         Complications: No apparent anesthesia complications

## 2019-01-25 NOTE — Interval H&P Note (Signed)
History and Physical Interval Note:  01/25/2019 1:48 PM  Holly Knox  has presented today for surgery, with the diagnosis of K80.20 CHOLELITHIASIS WITH OUT CHOLECYSTITIS.  The various methods of treatment have been discussed with the patient and family. After consideration of risks, benefits and other options for treatment, the patient has consented to  Procedure(s): LAPAROSCOPIC CHOLECYSTECTOMY (N/A) as a surgical intervention.  The patient's history has been reviewed, patient examined, no change in status, stable for surgery.  I have reviewed the patient's chart and labs.  Questions were answered to the patient's satisfaction.     Herbert Pun

## 2019-01-26 ENCOUNTER — Encounter: Payer: Self-pay | Admitting: General Surgery

## 2019-01-27 LAB — SURGICAL PATHOLOGY

## 2020-03-15 IMAGING — US ULTRASOUND ABDOMEN LIMITED
1 series · 14 of 25 positions shown · non-contrast
Comparison: None.

CLINICAL DATA: Right upper quadrant pain for 2 hours

EXAM:
ULTRASOUND ABDOMEN LIMITED RIGHT UPPER QUADRANT

[Series 1: ultrasound abdomen limited · 0.20mm/px · 14 of 49 slices shown]
[im 1/49]
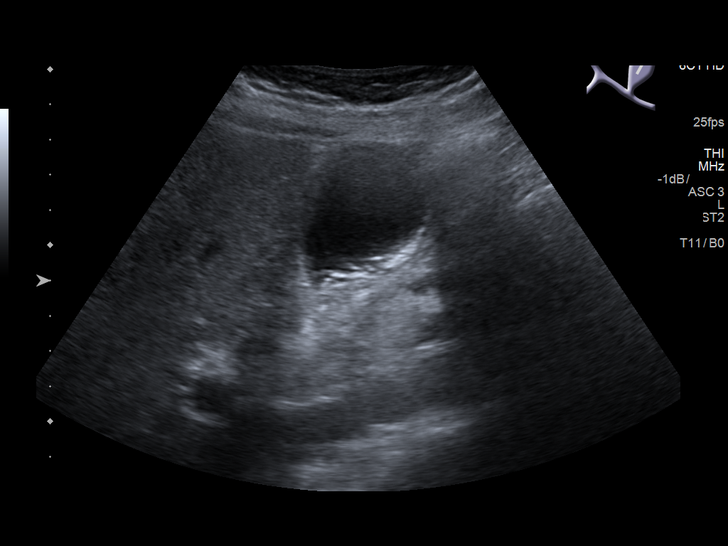
[im 5/49]
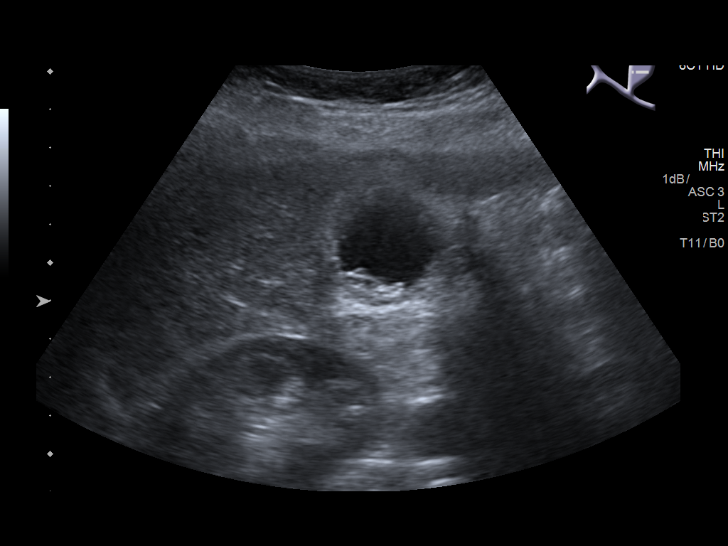
[im 9/49]
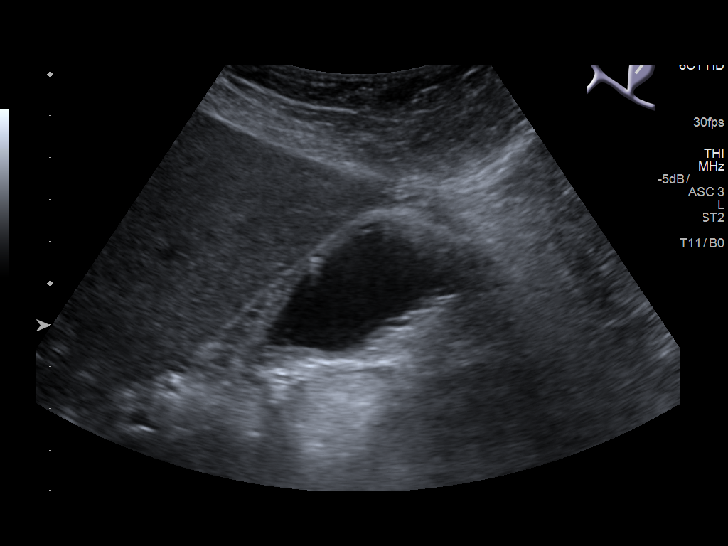
[im 13/49]
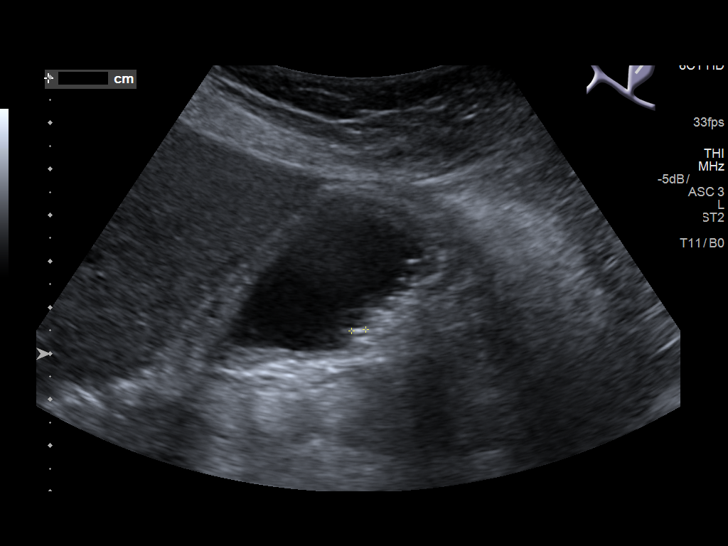
[im 17/49]
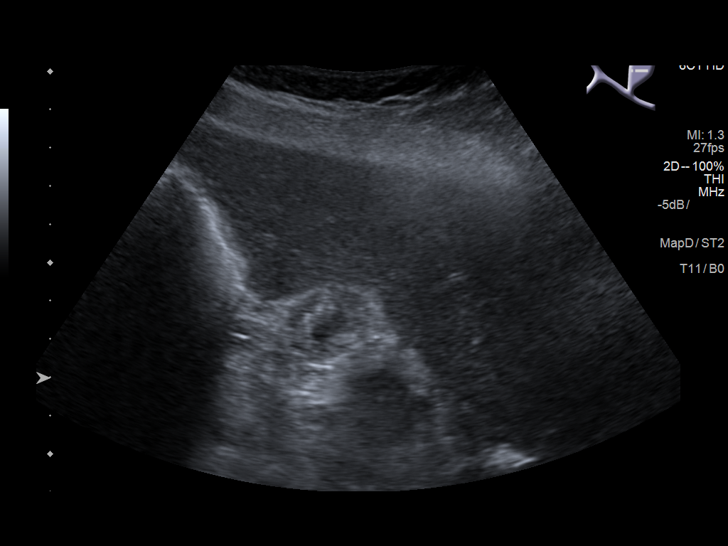
[im 19/49]
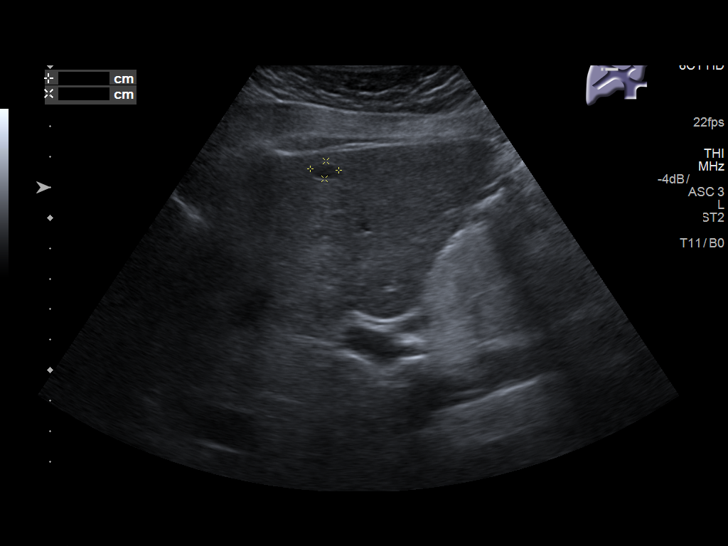
[im 23/49]
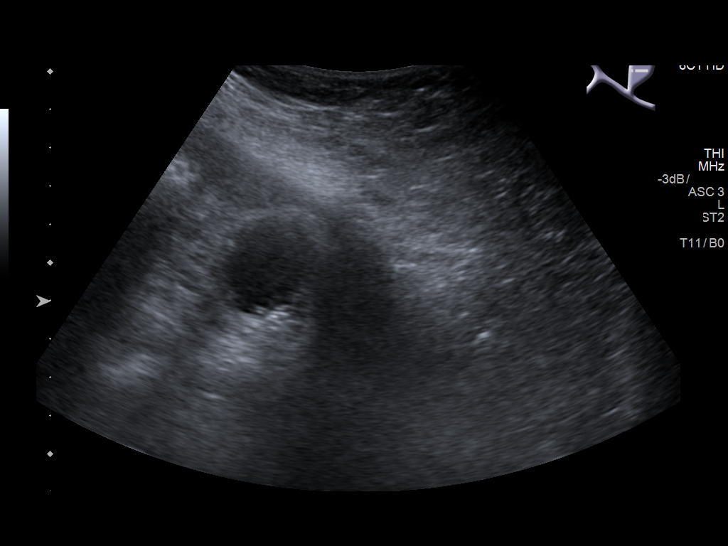
[im 27/49]
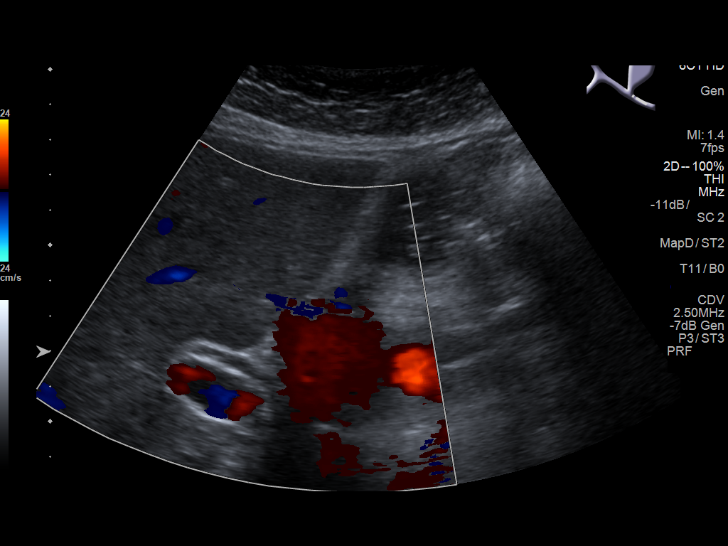
[im 31/49]
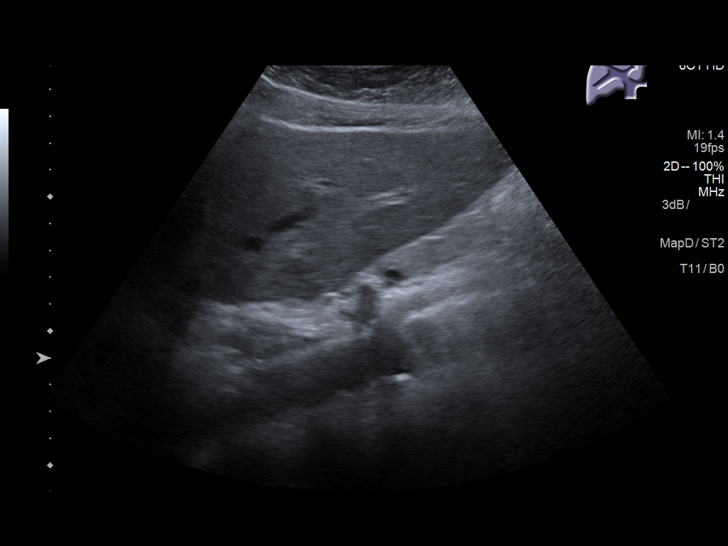
[im 33/49]
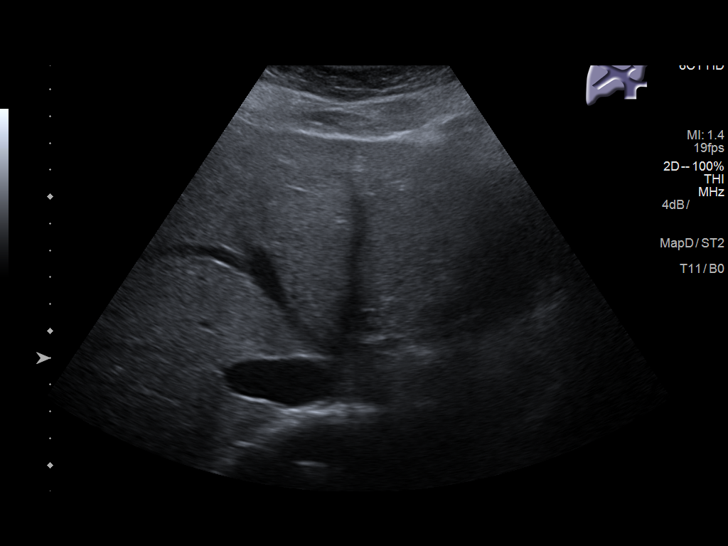
[im 37/49]
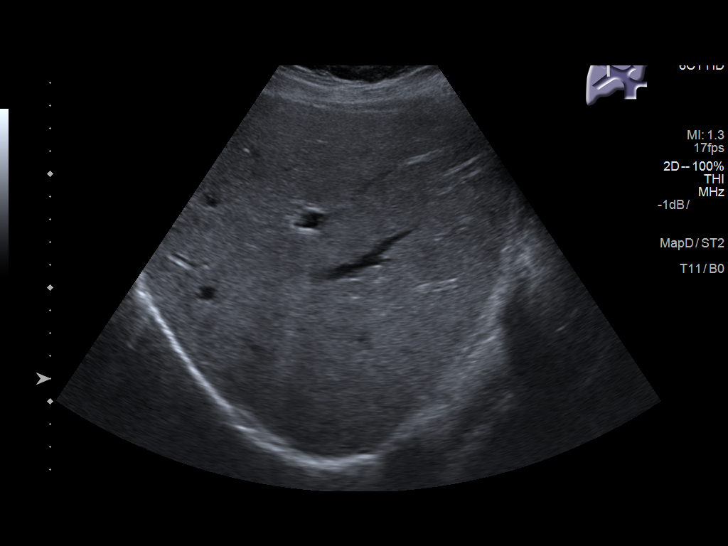
[im 41/49]
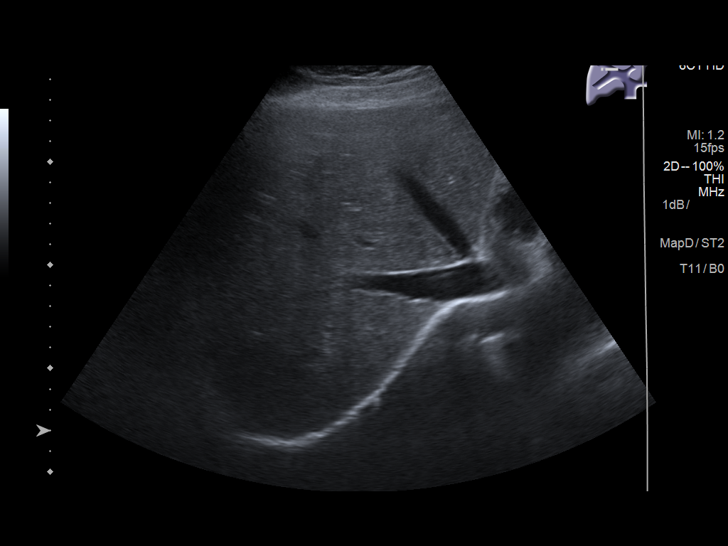
[im 45/49]
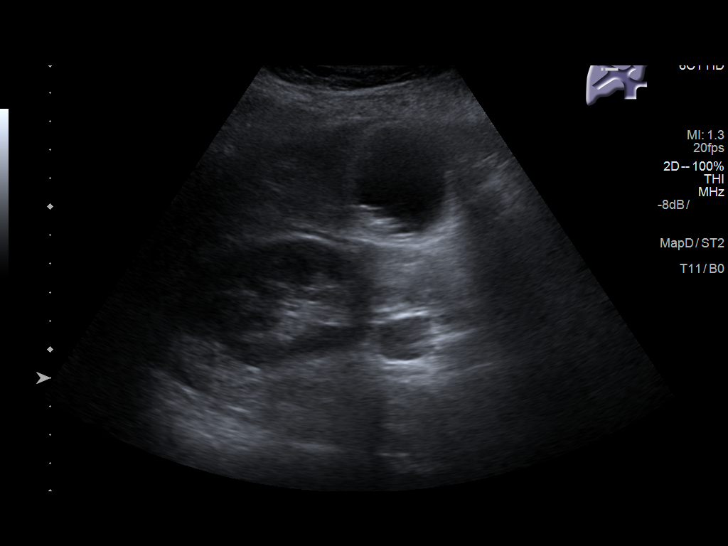
[im 49/49]
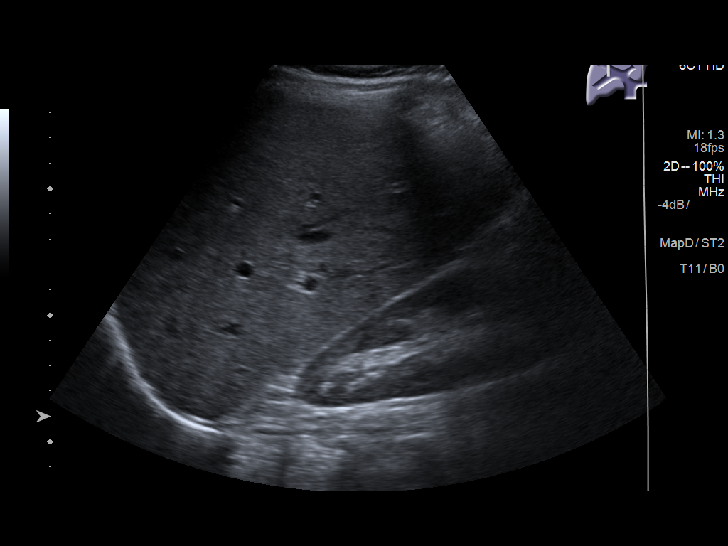

[14 of 25 positions shown; findings below may reference images not displayed]

FINDINGS: Gallbladder:

Gallbladder is well distended with multiple stones within. Negative
sonographic Murphy's sign is seen. Wall thickening is 7 mm is noted
with changes of adenomyomatosis.

Common bile duct:

Diameter: 3.9 mm.

Liver:

Echogenicity is within normal limits. Small 9 mm cyst is noted
within the right lobe anteriorly. Portal vein is patent on color
Doppler imaging with normal direction of blood flow towards the
liver.
IMPRESSION: Cholelithiasis.

Changes of adenomyomatosis.

Right hepatic cyst
